# Patient Record
Sex: Female | Born: 1935 | Race: White | Hispanic: No | Marital: Single | Smoking: Former smoker
Health system: Southern US, Community
[De-identification: ages and names within clinical notes are randomized; demographics above are authoritative.]

## PROBLEM LIST (undated history)

## (undated) DIAGNOSIS — K219 Gastro-esophageal reflux disease without esophagitis: Secondary | ICD-10-CM

## (undated) DIAGNOSIS — I4891 Unspecified atrial fibrillation: Secondary | ICD-10-CM

## (undated) DIAGNOSIS — I729 Aneurysm of unspecified site: Secondary | ICD-10-CM

## (undated) DIAGNOSIS — M1990 Unspecified osteoarthritis, unspecified site: Secondary | ICD-10-CM

## (undated) DIAGNOSIS — D491 Neoplasm of unspecified behavior of respiratory system: Secondary | ICD-10-CM

## (undated) DIAGNOSIS — R918 Other nonspecific abnormal finding of lung field: Secondary | ICD-10-CM

## (undated) DIAGNOSIS — C414 Malignant neoplasm of pelvic bones, sacrum and coccyx: Secondary | ICD-10-CM

## (undated) HISTORY — DX: Malignant neoplasm of pelvic bones, sacrum and coccyx: C41.4

## (undated) HISTORY — PX: OTHER SURGICAL HISTORY: SHX169

---

## 1958-06-30 HISTORY — PX: ABDOMINAL HYSTERECTOMY: SHX81

## 2011-07-01 DIAGNOSIS — I4891 Unspecified atrial fibrillation: Secondary | ICD-10-CM

## 2011-07-01 HISTORY — DX: Unspecified atrial fibrillation: I48.91

## 2015-01-16 ENCOUNTER — Encounter (INDEPENDENT_AMBULATORY_CARE_PROVIDER_SITE_OTHER): Payer: Self-pay

## 2015-01-16 ENCOUNTER — Ambulatory Visit (INDEPENDENT_AMBULATORY_CARE_PROVIDER_SITE_OTHER): Payer: Medicare HMO | Admitting: Internal Medicine

## 2015-01-16 ENCOUNTER — Encounter: Payer: Self-pay | Admitting: Internal Medicine

## 2015-01-16 VITALS — BP 100/70 | HR 82 | Ht 66.0 in | Wt 186.2 lb

## 2015-01-16 DIAGNOSIS — R918 Other nonspecific abnormal finding of lung field: Secondary | ICD-10-CM | POA: Diagnosis not present

## 2015-01-16 MED ORDER — ALPRAZOLAM 0.5 MG PO TABS: ORAL_TABLET | ORAL | Status: DC

## 2015-01-16 NOTE — Progress Notes (Addendum)
Subjective:    Patient ID: Diamond Knight, female    DOB: 1924/02/06,    MRN: [id number]  HPI   87 yowm quit smoking 1976 referred to pulmonary clinic 01/16/2015 by Dr Ulice Brilliant for mpns    01/16/2015 1st [facility] office visit/ Kao Berkheimer   Chief Complaint  Patient presents with  . Pulmonary Consult    Referred by Dr. Claudia Pollock for eval of Lung Mass. Pt states cxr was initially done b/c she had a cold 08/29/14. She c/o occ DOE.   has been on nocturnal 02 x > 5 y  And doe x one year x across the room then acutely ill with cough in March resolved but this resulted in a cxr > CT c/w multiple mets  New L Leg pain x 3 weeks eval by chiropractor with radiation down the leg he dx as siatica. No pain in leg with cough. No better p medrol. Had prev had bad knees slowing her down but not back pain hx or prev radiculopathy.   ? Up to date with health maint mammos/paps not sure ( s/p hysterectomy 44 y prior to [place] ) Really not limited by sob as much as by pain now  No obvious other patterns in day to day or daytime variabilty or assoc chronic cough or cp or chest tightness, subjective wheeze overt sinus or hb symptoms. No unusual exp hx or h/o childhood pna/ asthma or knowledge of premature birth.  Sleeping ok without nocturnal  or early am exacerbation  of respiratory  c/o's or need for noct saba. Also denies any obvious fluctuation of symptoms with weather or environmental changes or other aggravating or alleviating factors except as outlined above   Current Medications, Allergies, Complete Past Medical History, Past Surgical History, Family History, and Social History were reviewed in [employer] record.            Review of Systems  Constitutional: Positive for appetite change. Negative for fever, chills and unexpected weight change.  HENT: Negative for congestion, dental problem, ear pain, nosebleeds, postnasal drip, rhinorrhea, sinus pressure, sneezing, sore  throat, trouble swallowing and voice change.   Eyes: Negative for visual disturbance.  Respiratory: Negative for cough, choking and shortness of breath.   Cardiovascular: Negative for chest pain and leg swelling.  Gastrointestinal: Negative for vomiting, abdominal pain and diarrhea.  Genitourinary: Negative for difficulty urinating.  Musculoskeletal: Negative for arthralgias.  Skin: Negative for rash.  Neurological: Negative for tremors, syncope and headaches.  Hematological: Does not bruise/bleed easily.       Objective:   Physical Exam   amb wf nad in w/c due to L hip /leg pain    Wt Readings from Last 3 Encounters:  01/16/15 186 lb 3.2 oz (84.46 kg)    Vital signs reviewed    HEENT: nl dentition, turbinates, and orophanx. Nl external ear canals without cough reflex   NECK :  without JVD/Nodes/TM/ nl carotid upstrokes bilaterally   LUNGS: no acc muscle use, clear to A and P bilaterally without cough on insp or exp maneuvers   CV:  RRR  no s3 or murmur or increase in P2, no edema   ABD:  soft and nontender with nl excursion in the supine position. No bruits or organomegaly, bowel sounds nl  MS:  warm without deformities, calf tenderness, cyanosis or clubbing  SKIN: warm and dry without lesions    NEURO:  alert, approp, no deficits    CT chest 01/08/15 Increase  size and number of MPNs largest 1 cm  with centrilobular emphysema  Labs reviewed from 01/11/15 Wbc 16 k  On ? Dose steroids hct 42%     Assessment & Plan:

## 2015-01-16 NOTE — Patient Instructions (Addendum)
Please see patient coordinator before you leave today  to schedule PET scan and I will call with the results and we'll go from there  We need to get the latest labs from Dr Ragsdale's office but if you get another we need a Sed Rate too

## 2015-01-17 ENCOUNTER — Encounter: Payer: Self-pay | Admitting: Internal Medicine

## 2015-01-17 NOTE — Assessment & Plan Note (Signed)
These nodules are worrisome but still the largest is only 1 cm which lowers the sensitivity of any study besides an open lung bx to accurately obtain adequate tissue for dx.  Of greater concern is the new back / leg pain ? Could she have mets to L spine ? And where the primary is as it is unlikely to be lung based on the CT scan. Other causes for MPNs very unlikley including septic emboli or granulomatous dz so rec PET first and go from there  Discussed in detail all the  indications, usual  risks and alternatives  relative to the benefits with patient /fm who agree  to proceed with w/u as planned Total time = 14mreview case with pt/fmdiscussion/ counseling/ giving and going over instructions (see avs)

## 2015-01-23 ENCOUNTER — Ambulatory Visit (HOSPITAL_COMMUNITY)
Admission: RE | Admit: 2015-01-23 | Discharge: 2015-01-23 | Disposition: A | Discharge status: Home / Self Care | Payer: Medicare HMO | Source: Ambulatory Visit | Attending: Internal Medicine | Admitting: Internal Medicine

## 2015-01-23 DIAGNOSIS — N281 Cyst of kidney, acquired: Secondary | ICD-10-CM | POA: Insufficient documentation

## 2015-01-23 DIAGNOSIS — K8020 Calculus of gallbladder without cholecystitis without obstruction: Secondary | ICD-10-CM | POA: Insufficient documentation

## 2015-01-23 DIAGNOSIS — R918 Other nonspecific abnormal finding of lung field: Secondary | ICD-10-CM | POA: Insufficient documentation

## 2015-01-23 DIAGNOSIS — I2510 Atherosclerotic heart disease of native coronary artery without angina pectoris: Secondary | ICD-10-CM | POA: Insufficient documentation

## 2015-01-23 DIAGNOSIS — I517 Cardiomegaly: Secondary | ICD-10-CM | POA: Diagnosis not present

## 2015-01-23 DIAGNOSIS — C801 Malignant (primary) neoplasm, unspecified: Secondary | ICD-10-CM | POA: Diagnosis not present

## 2015-01-23 DIAGNOSIS — I714 Abdominal aortic aneurysm, without rupture: Secondary | ICD-10-CM | POA: Diagnosis not present

## 2015-01-23 DIAGNOSIS — M84454A Pathological fracture, pelvis, initial encounter for fracture: Secondary | ICD-10-CM | POA: Diagnosis not present

## 2015-01-23 DIAGNOSIS — C7951 Secondary malignant neoplasm of bone: Secondary | ICD-10-CM | POA: Insufficient documentation

## 2015-01-23 DIAGNOSIS — I700 Atherosclerosis of aorta: Secondary | ICD-10-CM | POA: Insufficient documentation

## 2015-01-23 LAB — GLUCOSE, CAPILLARY: GLUCOSE-CAPILLARY: 106 mg/dL — AB (ref 65–99)

## 2015-01-23 MED ORDER — FLUDEOXYGLUCOSE F - 18 (FDG) INJECTION
9.1000 | Freq: Once | INTRAVENOUS | Status: AC | PRN
  Administered 2015-01-23: 9.1 via INTRAVENOUS

## 2015-01-24 ENCOUNTER — Other Ambulatory Visit: Payer: Self-pay | Admitting: Internal Medicine

## 2015-01-24 DIAGNOSIS — R918 Other nonspecific abnormal finding of lung field: Secondary | ICD-10-CM

## 2015-01-29 ENCOUNTER — Other Ambulatory Visit: Payer: Self-pay | Admitting: Radiology

## 2015-01-30 ENCOUNTER — Ambulatory Visit (HOSPITAL_COMMUNITY)
Admission: RE | Admit: 2015-01-30 | Discharge: 2015-01-30 | Disposition: A | Discharge status: Home / Self Care | Payer: Medicare HMO | Source: Ambulatory Visit | Attending: Internal Medicine | Admitting: Internal Medicine

## 2015-01-30 ENCOUNTER — Encounter (HOSPITAL_COMMUNITY): Payer: Self-pay

## 2015-01-30 DIAGNOSIS — R918 Other nonspecific abnormal finding of lung field: Secondary | ICD-10-CM | POA: Diagnosis not present

## 2015-01-30 DIAGNOSIS — M899 Disorder of bone, unspecified: Secondary | ICD-10-CM | POA: Diagnosis present

## 2015-01-30 DIAGNOSIS — Z87891 Personal history of nicotine dependence: Secondary | ICD-10-CM | POA: Diagnosis not present

## 2015-01-30 DIAGNOSIS — C414 Malignant neoplasm of pelvic bones, sacrum and coccyx: Secondary | ICD-10-CM | POA: Diagnosis not present

## 2015-01-30 DIAGNOSIS — K219 Gastro-esophageal reflux disease without esophagitis: Secondary | ICD-10-CM | POA: Diagnosis not present

## 2015-01-30 DIAGNOSIS — I4891 Unspecified atrial fibrillation: Secondary | ICD-10-CM | POA: Insufficient documentation

## 2015-01-30 DIAGNOSIS — Z880 Allergy status to penicillin: Secondary | ICD-10-CM | POA: Insufficient documentation

## 2015-01-30 DIAGNOSIS — M549 Dorsalgia, unspecified: Secondary | ICD-10-CM | POA: Insufficient documentation

## 2015-01-30 DIAGNOSIS — Z79899 Other long term (current) drug therapy: Secondary | ICD-10-CM | POA: Insufficient documentation

## 2015-01-30 DIAGNOSIS — C801 Malignant (primary) neoplasm, unspecified: Secondary | ICD-10-CM | POA: Diagnosis not present

## 2015-01-30 HISTORY — DX: Unspecified osteoarthritis, unspecified site: M19.90

## 2015-01-30 HISTORY — DX: Other nonspecific abnormal finding of lung field: R91.8

## 2015-01-30 HISTORY — DX: Aneurysm of unspecified site: I72.9

## 2015-01-30 HISTORY — DX: Unspecified atrial fibrillation: I48.91

## 2015-01-30 HISTORY — DX: Neoplasm of unspecified behavior of respiratory system: D49.1

## 2015-01-30 HISTORY — DX: Gastro-esophageal reflux disease without esophagitis: K21.9

## 2015-01-30 LAB — CBC
HEMATOCRIT: 42.2 % (ref 36.0–46.0)
HEMOGLOBIN: 13.7 g/dL (ref 12.0–15.0)
MCH: 32.2 pg (ref 26.0–34.0)
MCHC: 32.5 g/dL (ref 30.0–36.0)
MCV: 99.3 fL (ref 78.0–100.0)
PLATELETS: 322 10*3/uL (ref 150–400)
RBC: 4.25 MIL/uL (ref 3.87–5.11)
RDW: 14.1 % (ref 11.5–15.5)
WBC: 11.3 10*3/uL — ABNORMAL HIGH (ref 4.0–10.5)

## 2015-01-30 LAB — APTT: aPTT: 27 seconds (ref 24–37)

## 2015-01-30 LAB — PROTIME-INR
INR: 1.07 (ref 0.00–1.49)
Prothrombin Time: 14.1 seconds (ref 11.6–15.2)

## 2015-01-30 MED ORDER — MIDAZOLAM HCL 2 MG/2ML IJ SOLN
INTRAMUSCULAR | Status: AC
  Filled 2015-01-30: qty 6

## 2015-01-30 MED ORDER — MIDAZOLAM HCL 2 MG/2ML IJ SOLN
INTRAMUSCULAR | Status: AC | PRN
  Administered 2015-01-30 (×2): 1 mg via INTRAVENOUS

## 2015-01-30 MED ORDER — SODIUM CHLORIDE 0.9 % IV SOLN
INTRAVENOUS | Status: DC
  Administered 2015-01-30: 500 mL via INTRAVENOUS

## 2015-01-30 MED ORDER — FENTANYL CITRATE (PF) 100 MCG/2ML IJ SOLN
INTRAMUSCULAR | Status: AC
  Filled 2015-01-30: qty 4

## 2015-01-30 MED ORDER — DILTIAZEM HCL 60 MG PO TABS
120.0000 mg | ORAL_TABLET | Freq: Once | ORAL | Status: AC
Start: 2015-01-30 — End: 2015-01-30
  Administered 2015-01-30: 120 mg via ORAL
  Filled 2015-01-30: qty 2

## 2015-01-30 MED ORDER — FENTANYL CITRATE (PF) 100 MCG/2ML IJ SOLN
INTRAMUSCULAR | Status: AC | PRN
  Administered 2015-01-30 (×2): 25 ug via INTRAVENOUS

## 2015-01-30 NOTE — H&P (Signed)
Chief Complaint: Patient was seen in consultation today for CT guided left iliac bone/ST mass biopsy    Referring Physician(s): Wert,Michael B  History of Present Illness: Diamond Knight is a 81 y.o. female , prior smoker, with history of lung nodules, back /left buttocks pain and recent PET scan demonstrating multifocal osseous metastases including a dominant permeative lesion involving the left iliac bone/sacroiliac joint with associated pathologic fracture and surrounding soft tissue component. She presents today for CT-guided biopsy of the left iliac bone/soft tissue mass.  Past Medical History  Diagnosis Date  . Atrial fibrillation 2013  . Tumor of lung     left thigh found by PCP and sent to Dr Melvyn Novas for evaluation  . Lung mass     3 pulmonary nodules seen by Dr Melvyn Novas  . Aneurysm     grandaughter statement" it was diagnosed  April 2016 and is 3.6 in size"  . GERD (gastroesophageal reflux disease)   . Arthritis     'in my back"    Past Surgical History  Procedure Laterality Date  . Abdominal hysterectomy  1960    Allergies: Penicillins  Medications: Prior to Admission medications   Medication Sig Start Date End Date Taking? Authorizing Provider  ALPRAZolam Duanne Moron) 0.5 MG tablet 1 or 2 tablets 1 hour prior to scan 01/16/15  Yes Tanda Rockers, MD  diltiazem (CARDIZEM) 120 MG tablet Take 120 mg by mouth 2 (two) times daily.   Yes Historical Provider, MD  levothyroxine (SYNTHROID, LEVOTHROID) 88 MCG tablet Take 88 mcg by mouth daily before breakfast.   Yes Historical Provider, MD  omeprazole (PRILOSEC) 20 MG capsule Take 20 mg by mouth daily.   Yes Historical Provider, MD  oxybutynin (DITROPAN) 5 MG tablet Take 5 mg by mouth daily.    Yes Historical Provider, MD  tiZANidine (ZANAFLEX) 2 MG tablet Take 2 mg by mouth every 6 (six) hours as needed for muscle spasms.   Yes Historical Provider, MD  traMADol (ULTRAM) 50 MG tablet Take 50 mg by mouth every 6 (six) hours as  needed for moderate pain.   Yes Historical Provider, MD     Family History  Problem Relation Age of Onset  . Prostate cancer Father     History   Social History  . Marital Status: Single    Spouse Name: N/A  . Number of Children: N/A  . Years of Education: N/A   Occupational History  . Retired- [employer] History Main Topics  . Smoking status: Former Smoker -- 0.50 packs/day for 10 years    Types: Cigarettes    Quit date: 06/30/1974  . Smokeless tobacco: Not on file  . Alcohol Use: No  . Drug Use: No  . Sexual Activity: Not on file   Other Topics Concern  . None   Social History Narrative      Review of Systems   Constitutional: Negative for fever and chills.  Respiratory: Negative for shortness of breath.        Occ cough  Cardiovascular: Negative for chest pain.  Gastrointestinal: Negative for vomiting, abdominal pain and blood in stool.       Occ nausea  Genitourinary: Positive for pelvic pain. Negative for dysuria and hematuria.  Musculoskeletal: Positive for back pain.  Neurological: Negative for headaches.    Vital Signs: Ht '5\' 6"'$  (1.676 m)  Wt 180 lb (81.647 kg)  BMI 29.07 kg/m2 blood pressure 125/77, heart rate 60, temperature  97.7, respirations 16, oxygen saturation is 97% room air  Physical Exam  Constitutional: She is oriented to person, place, and time. She appears well-developed and well-nourished.  Cardiovascular:  Irregularly irregular  Pulmonary/Chest: Effort normal and breath sounds normal.  Abdominal: Soft. Bowel sounds are normal.  Some mild inguinal tenderness to palpation, especially on left.  Musculoskeletal: Normal range of motion. She exhibits no edema.  Tenderness to palpation along lower back and left buttocks region  Neurological: She is alert and oriented to person, place, and time.    Mallampati Score:     Imaging: Nm Pet Image Initial (pi) Skull Base To Thigh  01/23/2015   CLINICAL DATA:  Initial  treatment strategy for multiple pulmonary nodules.  EXAM: NUCLEAR MEDICINE PET SKULL BASE TO THIGH  TECHNIQUE: 9.1 mCi F-18 FDG was injected intravenously. Full-ring PET imaging was performed from the skull base to thigh after the radiotracer. CT data was obtained and used for attenuation correction and anatomic localization.  FASTING BLOOD GLUCOSE:  Value: 106 mg/dl  COMPARISON:  [facility] CT abdomen pelvis dated 03/25/2012  FINDINGS: NECK  No hypermetabolic lymph nodes in the neck.  CHEST  Multiple bilateral pulmonary nodules, suspicious for metastases. Dominant nodules include:  --11 mm nodule at the posterior left lung apex (series 8/ image 13), max SUV 6.6  --7 mm nodule the right lung apex (series 8/image 16), max SUV 4.3  --8 mm nodule in the anterior left upper lobe (series 8/ image 23), max SUV 13.4  Mild cardiomegaly. No pericardial effusion. Coronary atherosclerosis. Atherosclerotic calcifications of the aortic arch. Mitral valve annular calcifications.  No hypermetabolic thoracic lymphadenopathy.  ABDOMEN/PELVIS  No abnormal hypermetabolic activity within the liver, pancreas, adrenal glands, or spleen.  Numerous layering gallstones, without associated inflammatory changes. Atherosclerotic calcifications of the abdominal aorta and branch vessels. 3.4 x 3.5 cm infrarenal abdominal aortic aneurysm. Two right renal cyst measuring up to 2.1 cm.  No hypermetabolic lymph nodes in the abdomen or pelvis.  SKELETON  Dominant permeative lesion with soft tissue component and associated pathologic fracture involving the left iliac bone and sacroiliac joint (series 4/image 147), max SUV 49.1. Although difficult to visualize on CT, there is a suspected intramuscular/soft tissue component (series 4/image 149).  Additional multifocal osseous metastases, including:  --Left clavicle, max SUV 10.6  --T7 vertebral body, max SUV 4.5  --Right 9th rib, max SUV 9.6  --Right T10 vertebral body, max SUV 4.0  --Anterior L2  vertebral body, max SUV 6.6  --Right iliac bone, max SUV 18.7  --Left iliac wing, max SUV 49.8  IMPRESSION: Multifocal pulmonary nodules, measuring up to 11 mm in the posterior left lung apex, suspicious for metastases.  Dominant permeative lesion involving the left iliac bone/sacroiliac joint, with associated pathologic fracture, and suspected surrounding intramuscular/soft tissue component. Consider tissue sampling.  Additional multifocal osseous metastases, as above.   Electronically Signed   By: Julian Hy M.D.   On: 01/23/2015 16:36    Labs:  CBC:  Recent Labs  01/30/15 1050  WBC 11.3*  HGB 13.7  HCT 42.2  PLT 322    COAGS:  Recent Labs  01/30/15 1050  INR 1.07  APTT 27    BMP: No results for input(s): NA, K, CL, CO2, GLUCOSE, BUN, CALCIUM, CREATININE, GFRNONAA, GFRAA in the last 8760 hours.  Invalid input(s): CMP  LIVER FUNCTION TESTS: No results for input(s): BILITOT, AST, ALT, ALKPHOS, PROT, ALBUMIN in the last 8760 hours.  TUMOR MARKERS: No results for input(s): AFPTM,  CEA, CA199, CHROMGRNA in the last 8760 hours.  Assessment and Plan: Diamond Knight is a 81 y.o. female , prior smoker, with history of lung nodules, back and left buttocks pain and recent PET scan demonstrating multifocal osseous metastases including a dominant permeative lesion involving the left iliac bone/sacroiliac joint with associated pathologic fracture and surrounding soft tissue component. She presents today for CT-guided biopsy of the left iliac bone/soft tissue mass.Risks and benefits discussed with the patient/family including, but not limited to bleeding, infection, damage to adjacent structures or low yield requiring additional tests.All of the patient's questions were answered, patient is agreeable to proceed.Consent signed and in chart.     Thank you for this interesting consult.  I greatly enjoyed meeting Lavone Barrientes and look forward to participating in their care.  A copy of  this report was sent to the requesting provider on this date.  Signed: D. Rowe Robert 01/30/2015, 12:31 PM   I spent a total of 30 minutes in face to face in clinical consultation, greater than 50% of which was counseling/coordinating care for CT-guided left iliac bone/soft tissue mass biopsy

## 2015-01-30 NOTE — Procedures (Signed)
Technically successful CT guided biopsy of lytic lesion in the posterior aspect of the left iliac crest  No immediate post procedural complications.   Ronny Bacon, MD Pager #: [phone]

## 2015-01-30 NOTE — Progress Notes (Signed)
Pt has met criteria for discharge. I sat patient on edge of bed to evaluate BP/ pulse sitting. Pt stood and c/o "terrible pain down left buttock and leg" Pt is wanting to go to [facility] and I assisted pt to [facility] via w/c and discussed further with her the pain she was experiencing. Pt and family agree this is the pain she has had in the past and he home medication of muscle relaxer and pain pill have helped. Pt states the pain is" terrible with ambulation and moving from sitting to standing". Pt insisted on ambulating back to stretcher to get dressed. Rowe Robert PA visited patient at my request and pt and family felt it was the pain she had experienced at home and she wanted to go home. After patient was dressed and sitting in w/c to go home she was unsure if the pain was better or worse and how long the drive home was to [place]. Last does of her pain medication was at 0600 this AM. She also expressed the statement that she" thought the procedure today was to relieve the pain". I restated to her that the biopsy was to determine what was causing the pain so it could be treated. . I offered to call Dr Dorena Dew and get something for her for pain but pt did not want to wait for this..Pt and family decided for her to go home, take her muscle relaxant and pain medication after eating something. Pt was discharged via w/c accompanied by staff and placed in daughter's car.

## 2015-01-30 NOTE — Discharge Instructions (Signed)
Bone Biopsy, Needle, Care After  Read the instructions outlined below and refer to this sheet in the next few weeks. These discharge instructions provide you with general information on caring for yourself after you leave the hospital. Your caregiver may also give you specific instructions. While your treatment has been planned according to the most current medical practices available, unavoidable complications sometimes occur. If you have any problems or questions after discharge, call your caregiver.  Finding out the results of your test  Not all test results are available during your visit. If your test results are not back during the visit, make an appointment with your caregiver to find out the results. Do not assume everything is normal if you have not heard from your caregiver or the medical facility. It is important for you to follow up on all of your test results.   SEEK MEDICAL CARE IF:    You have redness, swelling, or increasing pain at the site of the biopsy.   You have pus coming from the biopsy site.   You have drainage from the biopsy site lasting longer than 1 day.   You notice a bad smell coming from the biopsy site or dressing.   You develop persistent nausea or vomiting.  SEEK IMMEDIATE MEDICAL CARE IF:   You have a fever.   You develop a rash.   You have difficulty breathing.   You develop any reaction or side effects to medicines given.  Document Released: 01/03/2005 Document Revised: 04/06/2013 Document Reviewed: 11/21/2008  ExitCare Patient Information 2015 [employer]. This information is not intended to replace advice given to you by your health care provider. Make sure you discuss any questions you have with your health care provider.        Conscious Sedation  Sedation is the use of medicines to promote relaxation and relieve discomfort and anxiety. Conscious sedation is a type of sedation. Under conscious sedation you are less alert than normal but are still able to respond  to instructions or stimulation. Conscious sedation is used during short medical and dental procedures. It is milder than deep sedation or general anesthesia and allows you to return to your regular activities sooner.   LET YOUR HEALTH CARE PROVIDER KNOW ABOUT:    Any allergies you have.   All medicines you are taking, including vitamins, herbs, eye drops, creams, and over-the-counter medicines.   Use of steroids (by mouth or creams).   Previous problems you or members of your family have had with the use of anesthetics.   Any blood disorders you have.   Previous surgeries you have had.   Medical conditions you have.   Possibility of pregnancy, if this applies.   Use of cigarettes, alcohol, or illegal drugs.  RISKS AND COMPLICATIONS  Generally, this is a safe procedure. However, as with any procedure, problems can occur. Possible problems include:   Oversedation.   Trouble breathing on your own. You may need to have a breathing tube until you are awake and breathing on your own.   Allergic reaction to any of the medicines used for the procedure.  BEFORE THE PROCEDURE   You may have blood tests done. These tests can help show how well your kidneys and liver are working. They can also show how well your blood clots.   A physical exam will be done.   Only take medicines as directed by your health care provider. You may need to stop taking medicines (such as blood thinners, aspirin,   or nonsteroidal anti-inflammatory drugs) before the procedure.    Do not eat or drink at least 6 hours before the procedure or as directed by your health care provider.   Arrange for a responsible adult, family member, or friend to take you home after the procedure. He or she should stay with you for at least 24 hours after the procedure, until the medicine has worn off.  PROCEDURE    An intravenous (IV) catheter will be inserted into one of your veins. Medicine will be able to flow directly into your body through this  catheter. You may be given medicine through this tube to help prevent pain and help you relax.   The medical or dental procedure will be done.  AFTER THE PROCEDURE   You will stay in a recovery area until the medicine has worn off. Your blood pressure and pulse will be checked.    Depending on the procedure you had, you may be allowed to go home when you can tolerate liquids and your pain is under control.  Document Released: 03/11/2001 Document Revised: 06/21/2013 Document Reviewed: 02/21/2013  ExitCare Patient Information 2015 [employer]. This information is not intended to replace advice given to you by your health care provider. Make sure you discuss any questions you have with your health care provider.

## 2015-02-02 ENCOUNTER — Telehealth: Payer: Self-pay | Admitting: Internal Medicine

## 2015-02-02 NOTE — Telephone Encounter (Signed)
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  Advised that they will need to have further input from Dr. Melvyn Novas about implications of this diagnosis and what options are available for therapy, ie whether she would be a reasonable candidate for therapy given her age and comorbid conditions.  Will route to Dr.Wert.

## 2015-02-02 NOTE — Telephone Encounter (Signed)
VS please advise on results. Thanks.

## 2015-02-02 NOTE — Telephone Encounter (Signed)
Pt granddaughter calling, states she knows MW is on vacation but pt is anxious about pathology results and is requesting that if results are availble if someone can call and give them the results today , [phone]

## 2015-02-02 NOTE — Telephone Encounter (Signed)
Spoke with Dr Jacki Cones with [facility] [phone] Pt had CT Bx 01/30/15, report is in [place]. States that she does not feel it is urgent and needs to be addressed today. Can wait until Monday 02/05/15 when Dr Melvyn Novas returns.  Showed some possible malignant cells

## 2015-02-05 NOTE — Telephone Encounter (Signed)
Discussed with daughter, same message as I gave Abigail Butts pt's grand daughter earlier today

## 2015-02-05 NOTE — Telephone Encounter (Signed)
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

## 2015-02-05 NOTE — Telephone Encounter (Signed)
Pt daughter Ollen Gross is wanting a call to discuss the results- Granddaughter has not spoken with anyone in the family to deliver these results.  Advised that I would let Dr Melvyn Novas know that she is requesting a call asap.  Stanton Kidney states that she wishes to have results and anything else discussed with her as she is the patient's daughter and Abigail Butts is the Granddaughter.

## 2015-02-12 ENCOUNTER — Telehealth: Payer: Self-pay | Admitting: Internal Medicine

## 2015-02-12 DIAGNOSIS — C499 Malignant neoplasm of connective and soft tissue, unspecified: Secondary | ICD-10-CM

## 2015-02-12 NOTE — Telephone Encounter (Signed)
Daughter called back bc they have not heard back regarding oncology appt. Looking in chart referral was never placed. I advised will place referral.  MW please advise diagnosis to use? thanks

## 2015-02-12 NOTE — Telephone Encounter (Signed)
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

## 2015-02-13 NOTE — Telephone Encounter (Signed)
referral has been placed. Nothing further needed

## 2015-02-20 ENCOUNTER — Ambulatory Visit (HOSPITAL_BASED_OUTPATIENT_CLINIC_OR_DEPARTMENT_OTHER): Payer: Medicare HMO | Admitting: Hematology & Oncology

## 2015-02-20 ENCOUNTER — Ambulatory Visit: Payer: Medicare HMO

## 2015-02-20 ENCOUNTER — Other Ambulatory Visit (HOSPITAL_BASED_OUTPATIENT_CLINIC_OR_DEPARTMENT_OTHER): Payer: Medicare HMO

## 2015-02-20 ENCOUNTER — Telehealth: Payer: Self-pay | Admitting: Hematology & Oncology

## 2015-02-20 ENCOUNTER — Encounter: Payer: Self-pay | Admitting: Hematology & Oncology

## 2015-02-20 VITALS — BP 99/83 | HR 79 | Temp 98.0°F | Resp 16 | Ht 66.0 in | Wt 174.0 lb

## 2015-02-20 DIAGNOSIS — C499 Malignant neoplasm of connective and soft tissue, unspecified: Secondary | ICD-10-CM | POA: Diagnosis not present

## 2015-02-20 DIAGNOSIS — C7800 Secondary malignant neoplasm of unspecified lung: Secondary | ICD-10-CM

## 2015-02-20 DIAGNOSIS — C414 Malignant neoplasm of pelvic bones, sacrum and coccyx: Secondary | ICD-10-CM | POA: Insufficient documentation

## 2015-02-20 DIAGNOSIS — C419 Malignant neoplasm of bone and articular cartilage, unspecified: Secondary | ICD-10-CM

## 2015-02-20 DIAGNOSIS — R918 Other nonspecific abnormal finding of lung field: Secondary | ICD-10-CM

## 2015-02-20 DIAGNOSIS — R64 Cachexia: Secondary | ICD-10-CM

## 2015-02-20 HISTORY — DX: Malignant neoplasm of pelvic bones, sacrum and coccyx: C41.4

## 2015-02-20 LAB — CBC WITH DIFFERENTIAL (CANCER CENTER ONLY)
BASO#: 0.1 10*3/uL (ref 0.0–0.2)
BASO%: 0.5 % (ref 0.0–2.0)
EOS ABS: 0.2 10*3/uL (ref 0.0–0.5)
EOS%: 1.4 % (ref 0.0–7.0)
HEMATOCRIT: 41.9 % (ref 34.8–46.6)
HEMOGLOBIN: 13.7 g/dL (ref 11.6–15.9)
LYMPH#: 1.6 10*3/uL (ref 0.9–3.3)
LYMPH%: 10.5 % — ABNORMAL LOW (ref 14.0–48.0)
MCH: 32.6 pg (ref 26.0–34.0)
MCHC: 32.7 g/dL (ref 32.0–36.0)
MCV: 100 fL (ref 81–101)
MONO#: 1.5 10*3/uL — AB (ref 0.1–0.9)
MONO%: 9.6 % (ref 0.0–13.0)
NEUT%: 78 % (ref 39.6–80.0)
NEUTROS ABS: 12 10*3/uL — AB (ref 1.5–6.5)
Platelets: 320 10*3/uL (ref 145–400)
RBC: 4.2 10*6/uL (ref 3.70–5.32)
RDW: 13.7 % (ref 11.1–15.7)
WBC: 15.4 10*3/uL — AB (ref 3.9–10.0)

## 2015-02-20 LAB — TECHNOLOGIST REVIEW CHCC SATELLITE

## 2015-02-20 MED ORDER — MEGESTROL ACETATE 40 MG/ML PO SUSP: 400.0000 mg | Freq: Every day | ORAL | Status: AC

## 2015-02-20 MED ORDER — BUPRENORPHINE 5 MCG/HR TD PTWK: 5.0000 ug | MEDICATED_PATCH | TRANSDERMAL | Status: DC

## 2015-02-20 NOTE — Progress Notes (Signed)
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 OTHER    New Patient  : I do not know why I am here.  HPI: Diamond Knight is a very nice 81 year old white female. She does have atrial fibrillation area and  She started to complain of pain in the lower back that seem to go down the left leg. She had this for a few months. She was sent to a chiropractor. The chiropractic manipulations did not seem to really help.  She then underwent a chest x-ray. Surprising enough, this seemed to show multiple pulmonary nodules.  She then underwent a PET scan. This was done on July 26. This showed multiple pulmonary nodules. She also was found to have osseous lesions. She had a pathologic fracture in the left iliac bone and sacroiliac joint. This seemed to be uptake in T7, T10, and L2.  She subsequently underwent a biopsy of the left iliac crest lesion. This was done on August 2. The pathology report ([id number]) showed a malignant spindle cell neoplasm. It was felt to be consistent with a sarcoma. Of course, there is not enough tissue to make a definitive diagnosis.  She was self referred to the [facility] for an evaluation.  She does wear oxygen at night according to her daughter. Per she is having quite a bit of pain in the left hip area. She was not taking much for this. I think that a Butrans patch would not be a bad idea.  She's had no hemoptysis. She's had no cough.  Her appetite is down. She's lost some weight.  She has had no problems with bowels or bladder.  Overall, her performance status is ECOG 3.  She has smoked in the past. She probably has not smoked for 50 years.   Past Medical History  Diagnosis Date  . Atrial fibrillation 2013  . Tumor of lung     left thigh found by PCP and sent to Dr Melvyn Novas for evaluation  . Lung mass     3 pulmonary nodules seen by Dr Melvyn Novas  .  Aneurysm     grandaughter statement" it was diagnosed  April 2016 and is 3.6 in size"  . GERD (gastroesophageal reflux disease)   . Arthritis     'in my back"  . 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 Abdominal hysterectomy  1960  :   Current outpatient prescriptions:  .  ALPRAZolam (XANAX) 0.5 MG tablet, 1 or 2 tablets 1 hour prior to scan, Disp: 2 tablet, Rfl: 0 .  diltiazem (CARDIZEM) 120 MG tablet, Take 120 mg by mouth 2 (two) times daily., Disp: , Rfl:  .  levothyroxine (SYNTHROID, LEVOTHROID) 88 MCG tablet, Take 88 mcg by mouth daily before breakfast., Disp: , Rfl:  .  omeprazole (PRILOSEC) 20 MG capsule, Take 20 mg by mouth daily., Disp: , Rfl:  .  oxybutynin (DITROPAN) 5 MG tablet, Take 5 mg by mouth daily. , Disp: , Rfl:  .  tiZANidine (ZANAFLEX) 2 MG tablet, Take 2 mg by mouth every 6 (six) hours as needed for muscle spasms., Disp: , Rfl:  .  traMADol (ULTRAM) 50 MG tablet, Take 50 mg by mouth every 6 (six) hours as needed for moderate pain., Disp: , Rfl:  .  buprenorphine (BUTRANS) 5 MCG/HR PTWK patch, Place 1 patch (5 mcg total) onto  the skin once a week., Disp: 4 patch, Rfl: 0 .  megestrol (MEGACE) 40 MG/ML suspension, Take 10 mLs (400 mg total) by mouth daily., Disp: 480 mL, Rfl: 4:  :  Allergies  Allergen Reactions  . Penicillins Rash  :  Family History  Problem Relation Age of Onset  . Prostate cancer Father   :  Social History   Social History  . Marital Status: Single    Spouse Name: N/A  . Number of Children: N/A  . Years of Education: N/A   Occupational History  . Retired- [employer] History Main Topics  . Smoking status: Former Smoker -- 0.50 packs/day for 10 years    Types: Cigarettes    Quit date: 06/30/1974  . Smokeless tobacco: Not on file  . Alcohol Use: No  . Drug Use: No  . Sexual Activity: Not on file   Other Topics Concern  . Not on file   Social History  Narrative  :  Pertinent items are noted in HPI.  Exam: '@IPVITALS'$ @  elderly, thin white female in no obvious distress. Vital signs show temperature of 98. Pulse is 79. Blood pressure 99/83. Weight is 174 pounds. Head and neck exam shows no ocular or oral lesions. She has no palpable cervical or supraclavicular lymph nodes. Lungs are clear bilaterally. Cardiac exam irregular rate and rhythm consistent with atrial fibrillation. There are no  murmurs, rubs or bruits. Abdomen is soft. She has decent bowel sounds. There is no fluid wave. There is no palpable hepatosplenomegaly. Back exam shows some tenderness in the lower thoracic lumbar spine. There is some tenderness over the left lumbosacral region. Extremities shows no weakness in the legs. She has some trace edema in the legs. She has decreased range of motion of the joints with a left leg. Skin exam shows no rashes, ecchymoses or petechia. Neurological exam is nonfocal.    Recent Labs  02/20/15 1423  WBC 15.4*  HGB 13.7  HCT 41.9  PLT 320   No results for input(s): NA, K, CL, CO2, GLUCOSE, BUN, CREATININE, CALCIUM in the last 72 hours.  Blood smear review:  None  Pathology: See above   Assessment and Plan:  Diamond Knight is an 81 year old white female with metastatic spindle cell malignancy. This certainly appears to be consistent with a sarcoma. The pattern of spread is consistent with a sarcoma.  We certainly are not going to treat her with systemic chemotherapy. Her performance status is not that good in metastatic sarcoma response to chemotherapy, at best, is probably less than 20%.  I spent a good hour talking with she and her family. I mention the possibility that radiation therapy to the left sacroiliac region would not be a bad idea.  I've not gotten any additional scans on her. I'm unsure if she would be able to tolerate an MRI. However, I think if she were to have radiation, she probably would need an MRI.  She will agree to  having radiation. I will call to see if they can see her.  We brought up the fact that quality-of-life is the main goal here. As such, hospice would be a fantastic route for them to go. I think this would help out quite a bit. Her family is definitely in favor of this. Ms. Grist also would agree.  I did talk to her about end-of-life issues. She does not want to be placed on life support. I totally agree with this. Given her  underlying artificially would not improve quality of life and would just lead to further suffering. As such, she is no CODE BLUE.  For her appetite, maybe we can try some Megace elixir. We will see if this helps her.  I would like to see her back in about 3-4 weeks. I think this would be reasonable.  We will follow her prealbumin level to see how that trend.  When I see her back, I will get a chest x-ray on her so we can assess the pulmonary disease.

## 2015-02-20 NOTE — Telephone Encounter (Signed)
[place] Charity: [phone] Requesting Provider: Dr. Bonnielee Haff Treating Provider: Dr. Burney Gauze Visits: 6 Status: Approved Dates: 02/20/2015     COPY SCANNED

## 2015-02-21 ENCOUNTER — Telehealth: Payer: Self-pay | Admitting: Hematology & Oncology

## 2015-02-21 ENCOUNTER — Other Ambulatory Visit: Payer: Self-pay | Admitting: *Deleted

## 2015-02-21 ENCOUNTER — Telehealth: Payer: Self-pay | Admitting: *Deleted

## 2015-02-21 DIAGNOSIS — C414 Malignant neoplasm of pelvic bones, sacrum and coccyx: Secondary | ICD-10-CM

## 2015-02-21 LAB — COMPREHENSIVE METABOLIC PANEL
ALBUMIN: 3.5 g/dL — AB (ref 3.6–5.1)
ALK PHOS: 142 U/L — AB (ref 33–130)
ALT: 9 U/L (ref 6–29)
AST: 14 U/L (ref 10–35)
BUN: 28 mg/dL — ABNORMAL HIGH (ref 7–25)
CHLORIDE: 100 mmol/L (ref 98–110)
CO2: 19 mmol/L — ABNORMAL LOW (ref 20–31)
CREATININE: 1.4 mg/dL — AB (ref 0.60–0.88)
Calcium: 10 mg/dL (ref 8.6–10.4)
Glucose, Bld: 127 mg/dL — ABNORMAL HIGH (ref 65–99)
POTASSIUM: 4.4 mmol/L (ref 3.5–5.3)
Sodium: 135 mmol/L (ref 135–146)
TOTAL PROTEIN: 6.4 g/dL (ref 6.1–8.1)
Total Bilirubin: 1.2 mg/dL (ref 0.2–1.2)

## 2015-02-21 LAB — PREALBUMIN: PREALBUMIN: 7 mg/dL — AB (ref 17–34)

## 2015-02-21 NOTE — Telephone Encounter (Signed)
Per Dr Antonieta Pert request, patient referral made [facility]. Spoke to [facility] who will start the intake process.

## 2015-02-21 NOTE — Telephone Encounter (Signed)
Spoke with pt's daughter regarding Ct scan, time and instructions, and scheduled f/u appt for 6/26

## 2015-02-22 ENCOUNTER — Telehealth: Payer: Self-pay | Admitting: Hematology & Oncology

## 2015-02-22 MED ORDER — FENTANYL 12 MCG/HR TD PT72: 12.5000 ug | MEDICATED_PATCH | TRANSDERMAL | Status: DC

## 2015-02-22 NOTE — Addendum Note (Signed)
Addended by: Burney Gauze R on: 02/22/2015 11:54 AM   Modules accepted: Orders, Medications

## 2015-02-22 NOTE — Telephone Encounter (Signed)
[facility] XGEVA Denosumab J0171 : Injection, adrenalin, epinephrine  - NPR  Valid: 03/28/2015 APPROVED  Dx: C41.4 - Sarcoma of bone of pelvis  P: [phone] F: [phone]

## 2015-02-28 ENCOUNTER — Telehealth: Payer: Self-pay | Admitting: Hematology & Oncology

## 2015-02-28 NOTE — Telephone Encounter (Signed)
Lt mess regarding adding to the 9/28 appt the [facility]

## 2015-03-01 ENCOUNTER — Telehealth: Payer: Self-pay | Admitting: Hematology & Oncology

## 2015-03-01 NOTE — Telephone Encounter (Signed)
[place] Nbr: [id number] Status: APPROVED Dates: 03/28/2015-06/26/2015 CPT: H3643 [place] See

## 2015-03-02 NOTE — Progress Notes (Signed)
Histology and Location of Primary Cancer: Metastatic spindle cell tumor-sarcoma   Sites of Visceral and Bony Metastatic Disease: pulmonary metastas in the posterior left lung apex, dominant permeative lesion involving the left iliac bone/sacroiliac joint   Location(s) of Symptomatic Metastases: left iliac/sacroiliac joint  Biopsies revealed:  01/30/15  Diagnosis Bone, biopsy, lytic mass,posterior left iliac crest - MALIGNANT SPINDLE CELL NEOPLASM, SEE COMMENT.  Past/Anticipated chemotherapy by medical oncology, if any: none  Pain on a scale of 0-10 is: 0 - reports pain comes and goes - can go up to a 10    Current Decadron regimen, if applicable: noe  Ambulatory status? Walker? Wheelchair?: uses a cane and walker  SAFETY ISSUES:  Prior radiation? no  Pacemaker/ICD? no  Possible current pregnancy? no  Is the patient on methotrexate? no  Current Complaints / other details:  Patient is here with her daughter.  She reports problems with constipation.  Her last bm was on Saturday.  She reports urinary frequency.  She reports her appetite is improving.  BP 126/77 mmHg  Pulse 69  Temp(Src) 98.3 F (36.8 C) (Oral)  Resp 16  Ht '5\' 6"'$  (1.676 m)  Wt 174 lb 14.4 oz (79.334 kg)  BMI 28.24 kg/m2  SpO2 96%   Wt Readings from Last 3 Encounters:  03/07/15 174 lb 14.4 oz (79.334 kg)  02/20/15 174 lb (78.926 kg)  01/30/15 180 lb (81.647 kg)

## 2015-03-07 ENCOUNTER — Encounter: Payer: Self-pay | Admitting: Radiation Oncology

## 2015-03-07 ENCOUNTER — Ambulatory Visit
Admission: RE | Admit: 2015-03-07 | Discharge: 2015-03-07 | Disposition: A | Discharge status: Home / Self Care | Payer: Medicare Other | Source: Ambulatory Visit | Attending: Radiation Oncology | Admitting: Radiation Oncology

## 2015-03-07 VITALS — BP 126/77 | HR 69 | Temp 98.3°F | Resp 16 | Ht 66.0 in | Wt 174.9 lb

## 2015-03-07 DIAGNOSIS — Z510 Encounter for antineoplastic radiation therapy: Secondary | ICD-10-CM | POA: Diagnosis not present

## 2015-03-07 DIAGNOSIS — C7951 Secondary malignant neoplasm of bone: Secondary | ICD-10-CM | POA: Insufficient documentation

## 2015-03-07 DIAGNOSIS — Z87891 Personal history of nicotine dependence: Secondary | ICD-10-CM | POA: Diagnosis not present

## 2015-03-07 DIAGNOSIS — K219 Gastro-esophageal reflux disease without esophagitis: Secondary | ICD-10-CM | POA: Diagnosis not present

## 2015-03-07 DIAGNOSIS — M1990 Unspecified osteoarthritis, unspecified site: Secondary | ICD-10-CM | POA: Insufficient documentation

## 2015-03-07 DIAGNOSIS — I729 Aneurysm of unspecified site: Secondary | ICD-10-CM | POA: Diagnosis not present

## 2015-03-07 DIAGNOSIS — C3492 Malignant neoplasm of unspecified part of left bronchus or lung: Secondary | ICD-10-CM | POA: Insufficient documentation

## 2015-03-07 DIAGNOSIS — I4891 Unspecified atrial fibrillation: Secondary | ICD-10-CM | POA: Insufficient documentation

## 2015-03-07 DIAGNOSIS — C414 Malignant neoplasm of pelvic bones, sacrum and coccyx: Secondary | ICD-10-CM

## 2015-03-07 NOTE — Progress Notes (Signed)
Radiation Oncology         [phone] ________________________________  Initial Outpatient Consultation  Name: Diamond Knight MRN: [id number]  Date: 03/07/2015  DOB: Mar 27, 1924  TK:[id number], Joelene Millin, MD  Charlott Rakes, MD   REFERRING PHYSICIAN: Charlott Rakes, MD  DIAGNOSIS: 81 yo woman with Metastatic spindle cell tumor-sarcoma, pulmonary metastasis in the posterior left lung apex, dominant permeative lesion involving the left iliac bone/sacroiliac joint    HISTORY OF PRESENT ILLNESS::Diamond Knight is a 81 y.o. female who is seen as courtesy to Dr. Marin Olp.   Diamond Knight is a very nice 81 year old white female. She does have atrial fibrillation area and she started to complain of pain in the lower back that seem to go down the left leg. She had this for a few months. She was sent to a chiropractor. The chiropractic manipulations did not seem to really help. She then underwent a chest x-ray. Surprising enough, this seemed to show multiple pulmonary nodules. She then underwent a PET scan. This was done on July 26. This showed multiple pulmonary nodules. She also was found to have osseous lesions. She had a pathologic fracture in the left iliac bone and sacroiliac joint. This seemed to be uptake in T7, T10, and L2. She subsequently underwent a biopsy of the left iliac crest lesion. This was done on August 2. The pathology report  showed a malignant spindle cell neoplasm. It was felt to be consistent with a sarcoma. Light of the patient's significant pain she is now referred to radiation oncology for active treatment. Given the histology and the patient's age she is not a candidate for systemic therapy. She is recently been enrolled in hospice.     PREVIOUS RADIATION THERAPY: No  PAST MEDICAL HISTORY:  has a past medical history of Atrial fibrillation (2013); Tumor of lung; Lung mass; Aneurysm; GERD (gastroesophageal reflux disease); Arthritis; and Sarcoma of bone of pelvis  (02/20/2015).    PAST SURGICAL HISTORY: Past Surgical History  Procedure Laterality Date  . Abdominal hysterectomy  1960  . Bladder surgery      mesh placement    FAMILY HISTORY: family history includes Prostate cancer in her father.  SOCIAL HISTORY:  reports that she quit smoking about 40 years ago. Her smoking use included Cigarettes. She has a 5 pack-year smoking history. She does not have any smokeless tobacco history on file. She reports that she does not drink alcohol or use illicit drugs.  ALLERGIES: Penicillins  MEDICATIONS:  Current Outpatient Prescriptions  Medication Sig Dispense Refill  . ALPRAZolam (XANAX) 0.5 MG tablet 1 or 2 tablets 1 hour prior to scan 2 tablet 0  . diltiazem (CARDIZEM) 120 MG tablet Take 120 mg by mouth 2 (two) times daily.    . fentaNYL (DURAGESIC) 12 MCG/HR Place 1 patch (12.5 mcg total) onto the skin every 3 (three) days. 10 patch 0  . levothyroxine (SYNTHROID, LEVOTHROID) 88 MCG tablet Take 88 mcg by mouth daily before breakfast.    . megestrol (MEGACE) 40 MG/ML suspension Take 10 mLs (400 mg total) by mouth daily. 480 mL 4  . omeprazole (PRILOSEC) 20 MG capsule Take 20 mg by mouth daily.    Marland Kitchen oxybutynin (DITROPAN) 5 MG tablet Take 5 mg by mouth daily.     . polyethylene glycol (MIRALAX / GLYCOLAX) packet Take 17 g by mouth daily.    . traMADol (ULTRAM) 50 MG tablet Take 50 mg by mouth every 6 (six) hours as needed for moderate pain.    Marland Kitchen  tiZANidine (ZANAFLEX) 2 MG tablet Take 2 mg by mouth every 6 (six) hours as needed for muscle spasms.     No current facility-administered medications for this encounter.    REVIEW OF SYSTEMS:  A 15 point review of systems is documented in the electronic medical record. This was obtained by the nursing staff. However, I reviewed this with the patient to discuss relevant findings and make appropriate changes.    Patient is here with her daughter. Patient presents in wheelchair. She reports problems with  constipation. Her last bm was on Saturday. She reports urinary frequency. She reports her appetite is improving. She reports severe left leg and pelvic pain. Pain on a scale of 0-10 is: 0 - reports pain comes and goes - can go up to a 10. She has had recent stomach pain that the daughter believes may be a UTI. Daughter notes they will be hiring home health in the near future. Daughter notes that Diamond Knight has been breathing heavily today, and attributes this to being anxious about upcoming potential radiation treatment.  Denies chest pain and shortness of breath. Reports tenderness at biopsy site. She uses a cane and walker at home.   PHYSICAL EXAM:  height is '5\' 6"'$  (1.676 m) and weight is 174 lb 14.4 oz (79.334 kg). Her oral temperature is 98.3 F (36.8 C). Her blood pressure is 126/77 and her pulse is 69. Her respiration is 16 and oxygen saturation is 96%.    Pleasant somewhat anxious female in no acute distress. She is somewhat hard of hearing. She is accompanied by her daughter on evaluation today. Neck: No palpable cervical or supraclavicular adenopathy Lungs: Mild crackles in the left base.  Heart: Heart has regular rhythm consistent with atrial fibrillation. Extremities: Mild edema in the ankle and foot areas. No significant tenderness noted in the left sacroiliac area. A small scars noted from the patient's recent biopsy. No signs of infection. The patient is able to stand with assistance. She ambulates around her home with the assistance of a walker.     ECOG = 3   LABORATORY DATA:  Lab Results  Component Value Date   WBC 15.4* 02/20/2015   HGB 13.7 02/20/2015   HCT 41.9 02/20/2015   MCV 100 02/20/2015   PLT 320 02/20/2015   NEUTROABS 12.0* 02/20/2015   Lab Results  Component Value Date   NA 135 02/20/2015   K 4.4 02/20/2015   CL 100 02/20/2015   CO2 19* 02/20/2015   GLUCOSE 127* 02/20/2015   CREATININE 1.40* 02/20/2015   CALCIUM 10.0 02/20/2015      RADIOGRAPHY:  PET  Scan:   IMPRESSION: Multifocal pulmonary nodules, measuring up to 11 mm in the posterior left lung apex, suspicious for metastases.  Dominant permeative lesion involving the left iliac bone/sacroiliac joint, with associated pathologic fracture, and suspected surrounding intramuscular/soft tissue component. Consider tissue sampling.  Additional multifocal osseous metastases, as above.  IMPRESSION: Diamond Knight is a pleasant 81 yo woman with metastatic spindle cell tumor-sarcoma, pulmonary metastasis in the posterior left lung apex, dominant permeative lesion involving the left iliac bone/sacroiliac joint. I have recommended palliative radiotherapy to help reduce her left iliac pain. I discussed the course of treatment side effects and potential toxicities and anticipated benefits from palliative radiation therapy directed at the left sacroiliac joint area.  PLAN:  Diamond Knight would not like radiation treatment at this time. She may reconsider radiation treatment if the pain worsens. Will follow up as needed.  This document serves as a record of services personally performed by Gery Pray, MD. It was created on his behalf by Arlyce Harman, a trained medical scribe. The creation of this record is based on the scribe's personal observations and the provider's statements to them. This document has been checked and approved by the attending provider. ------------------------------------------------  Diamond Promise, PhD, MD

## 2015-03-07 NOTE — Progress Notes (Signed)

## 2015-03-08 ENCOUNTER — Other Ambulatory Visit: Payer: Self-pay | Admitting: *Deleted

## 2015-03-12 ENCOUNTER — Telehealth: Payer: Self-pay | Admitting: *Deleted

## 2015-03-12 DIAGNOSIS — R918 Other nonspecific abnormal finding of lung field: Secondary | ICD-10-CM

## 2015-03-12 DIAGNOSIS — C414 Malignant neoplasm of pelvic bones, sacrum and coccyx: Secondary | ICD-10-CM

## 2015-03-12 DIAGNOSIS — C419 Malignant neoplasm of bone and articular cartilage, unspecified: Secondary | ICD-10-CM

## 2015-03-12 DIAGNOSIS — R64 Cachexia: Secondary | ICD-10-CM

## 2015-03-12 MED ORDER — FENTANYL 25 MCG/HR TD PT72: 25.0000 ug | MEDICATED_PATCH | TRANSDERMAL | Status: AC

## 2015-03-12 MED ORDER — ALPRAZOLAM 0.5 MG PO TABS: 0.5000 mg | ORAL_TABLET | Freq: Four times a day (QID) | ORAL | Status: AC | PRN

## 2015-03-12 NOTE — Telephone Encounter (Signed)
Sharyn Lull RN from [facility] stating that patient is still c/o pain. She is not controlled with her current fentanyl patch and prn tramadol. She is also more anxious. Relayed information to Dr Marin Olp and he wants patient to increase fentanyl dose and start on Xanax. Prescriptions will be faxed in. Sharyn Lull notified of new medication orders.

## 2015-03-13 ENCOUNTER — Encounter: Payer: Self-pay | Admitting: *Deleted

## 2015-03-13 NOTE — Progress Notes (Signed)
[facility] Psychosocial Distress Screening Clinical Social Work  Clinical Social Work was referred by distress screening protocol.  The patient scored a 10 on the Psychosocial Distress Thermometer which indicates severe distress. Clinical Social Worker reviewed chart to assess for distress and other psychosocial needs. Pt currently referred to hospice and pt has declined radiation and aggressive treatment at this point in time. [facility] following and assisting with pain, care and emotional needs at this time. Pt currently getting a medication adjustment for her anxiety concerns as well. If pt returns to [facility] for future concerns, CSW will assist.   ONCBCN DISTRESS SCREENING 03/07/2015  Screening Type Initial Screening  Distress experienced in past week (1-10) 10  Emotional problem type Depression;Nervousness/Anxiety;Adjusting to illness;Isolation/feeling alone  Spiritual/Religous concerns type Loss of sense of purpose  Physical Problem type Pain;Getting around;Bathing/dressing;Breathing;Loss of appetitie;Constipation/diarrhea;Skin dry/itchy  Physician notified of physical symptoms Yes    Clinical Social Worker follow up needed: No.  If yes, follow up plan: Loren Racer, [place]  [facility] Phone: [phone] Fax: [phone]

## 2015-03-19 ENCOUNTER — Other Ambulatory Visit: Payer: Self-pay

## 2015-03-19 ENCOUNTER — Ambulatory Visit: Payer: Medicare HMO

## 2015-03-19 MED ORDER — MORPHINE SULFATE (CONCENTRATE) 20 MG/ML PO SOLN: 10.0000 mg | ORAL | Status: AC | PRN

## 2015-03-19 MED ORDER — LORAZEPAM 1 MG PO TABS
1.0000 mg | ORAL_TABLET | ORAL | Status: AC | PRN
Start: 2015-03-19 — End: ?

## 2015-03-21 ENCOUNTER — Ambulatory Visit: Payer: Medicare HMO

## 2015-03-26 ENCOUNTER — Other Ambulatory Visit: Payer: Medicare HMO

## 2015-03-26 ENCOUNTER — Ambulatory Visit: Payer: Medicare HMO | Admitting: Hematology & Oncology

## 2015-03-27 ENCOUNTER — Encounter: Payer: Self-pay | Admitting: *Deleted

## 2015-03-27 NOTE — Progress Notes (Signed)
Received notice from [facility] that patient passed away at home on 05-Apr-2015 at 2:20p  Dr Marin Olp notified.

## 2015-03-28 ENCOUNTER — Other Ambulatory Visit: Payer: Medicare HMO

## 2015-03-28 ENCOUNTER — Other Ambulatory Visit (HOSPITAL_BASED_OUTPATIENT_CLINIC_OR_DEPARTMENT_OTHER): Payer: Medicare HMO

## 2015-03-28 ENCOUNTER — Ambulatory Visit: Payer: Medicare HMO | Admitting: Hematology & Oncology

## 2015-03-28 ENCOUNTER — Ambulatory Visit: Payer: Medicare HMO

## 2015-03-31 DEATH — deceased

## 2015-12-10 IMAGING — CT NM PET TUM IMG INITIAL (PI) SKULL BASE T - THIGH
1 of 8 series · 1 of 25 positions shown · non-contrast
Comparison: [REDACTED] CT abdomen pelvis dated 03/25/2012

CLINICAL DATA: Initial treatment strategy for multiple pulmonary
nodules.

EXAM:
NUCLEAR MEDICINE PET SKULL BASE TO THIGH
TECHNIQUE: 9.1 mCi F-18 FDG was injected intravenously. Full-ring PET imaging
was performed from the skull base to thigh after the radiotracer. CT
data was obtained and used for attenuation correction and anatomic
localization.
FASTING BLOOD GLUCOSE:  Value: 106 mg/dl

[Series 4: ct sk_thigh 5.0 b31f · axial · 5.0mm · 0.98mm/px · 1 of 215 slices shown]
[im 215/215  brain]
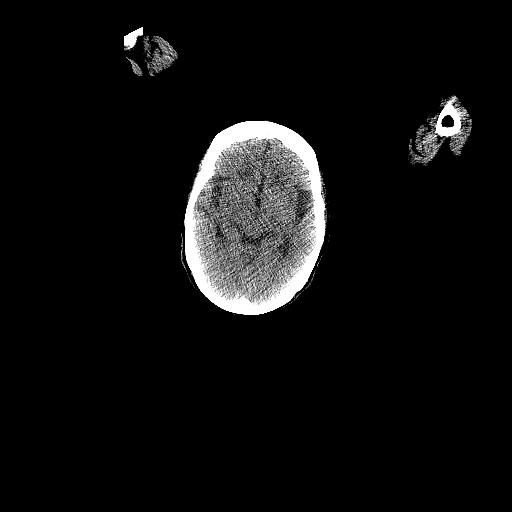

[1 of 25 positions shown; findings below may reference images not displayed]

FINDINGS: NECK

No hypermetabolic lymph nodes in the neck.

CHEST

Multiple bilateral pulmonary nodules, suspicious for metastases.
Dominant nodules include:

--11 mm nodule at the posterior left lung apex (series 8/ image 13),
max SUV

--7 mm nodule the right lung apex (series 8/image 16), max SUV

--8 mm nodule in the anterior left upper lobe (series 8/ image 23),
max SUV

Mild cardiomegaly. No pericardial effusion. Coronary
atherosclerosis. Atherosclerotic calcifications of the aortic arch.
Mitral valve annular calcifications.

No hypermetabolic thoracic lymphadenopathy.

ABDOMEN/PELVIS

No abnormal hypermetabolic activity within the liver, pancreas,
adrenal glands, or spleen.

Numerous layering gallstones, without associated inflammatory
changes. Atherosclerotic calcifications of the abdominal aorta and
branch vessels. 3.4 x 3.5 cm infrarenal abdominal aortic aneurysm.
Two right renal cyst measuring up to 2.1 cm.

No hypermetabolic lymph nodes in the abdomen or pelvis.

SKELETON

Dominant permeative lesion with soft tissue component and associated
pathologic fracture involving the left iliac bone and sacroiliac
joint (series 4/image 147), max SUV 49.1. Although difficult to
visualize on CT, there is a suspected intramuscular/soft tissue
component (series 4/image 149).

Additional multifocal osseous metastases, including:

--Left clavicle, max SUV

--T7 vertebral body, max SUV

--Right 9th rib, max SUV

--Right T10 vertebral body, max SUV

--Anterior L2 vertebral body, max SUV

--Right iliac bone, max SUV

--Left iliac wing, max SUV
IMPRESSION: Multifocal pulmonary nodules, measuring up to 11 mm in the posterior
left lung apex, suspicious for metastases.

Dominant permeative lesion involving the left iliac bone/sacroiliac
joint, with associated pathologic fracture, and suspected
surrounding intramuscular/soft tissue component. Consider tissue
sampling.

Additional multifocal osseous metastases, as above.

## 2015-12-19 ENCOUNTER — Other Ambulatory Visit: Payer: Self-pay | Admitting: Nurse Practitioner
